# Patient Record
Sex: Male | Born: 1990 | Race: Black or African American | Hispanic: No | Marital: Married
Health system: Southern US, Community
[De-identification: ages and names within clinical notes are randomized; demographics above are authoritative.]

---

## 2019-06-25 DIAGNOSIS — L03119 Cellulitis of unspecified part of limb: Secondary | ICD-10-CM | POA: Diagnosis not present

## 2019-06-25 DIAGNOSIS — T148XXA Other injury of unspecified body region, initial encounter: Secondary | ICD-10-CM | POA: Diagnosis not present

## 2020-04-13 ENCOUNTER — Ambulatory Visit
Admission: RE | Admit: 2020-04-13 | Discharge: 2020-04-13 | Disposition: A | Discharge status: Home / Self Care | Payer: BC Managed Care – PPO | Source: Ambulatory Visit | Attending: Chiropractic Medicine | Admitting: Chiropractic Medicine

## 2020-04-13 ENCOUNTER — Other Ambulatory Visit: Payer: Self-pay | Admitting: Chiropractic Medicine

## 2020-04-13 DIAGNOSIS — M25572 Pain in left ankle and joints of left foot: Secondary | ICD-10-CM

## 2020-04-13 DIAGNOSIS — M542 Cervicalgia: Secondary | ICD-10-CM | POA: Diagnosis not present

## 2020-04-13 DIAGNOSIS — M545 Low back pain, unspecified: Secondary | ICD-10-CM

## 2020-04-13 DIAGNOSIS — M25551 Pain in right hip: Secondary | ICD-10-CM

## 2020-04-13 DIAGNOSIS — R102 Pelvic and perineal pain: Secondary | ICD-10-CM | POA: Diagnosis not present

## 2020-04-13 DIAGNOSIS — M546 Pain in thoracic spine: Secondary | ICD-10-CM | POA: Diagnosis not present

## 2020-07-17 DIAGNOSIS — Z0000 Encounter for general adult medical examination without abnormal findings: Secondary | ICD-10-CM | POA: Diagnosis not present

## 2020-07-17 DIAGNOSIS — Z13220 Encounter for screening for lipoid disorders: Secondary | ICD-10-CM | POA: Diagnosis not present

## 2020-08-17 DIAGNOSIS — Z20828 Contact with and (suspected) exposure to other viral communicable diseases: Secondary | ICD-10-CM | POA: Diagnosis not present

## 2020-08-17 DIAGNOSIS — Z20822 Contact with and (suspected) exposure to covid-19: Secondary | ICD-10-CM | POA: Diagnosis not present

## 2020-10-18 DIAGNOSIS — F4323 Adjustment disorder with mixed anxiety and depressed mood: Secondary | ICD-10-CM | POA: Diagnosis not present

## 2020-10-25 DIAGNOSIS — F4323 Adjustment disorder with mixed anxiety and depressed mood: Secondary | ICD-10-CM | POA: Diagnosis not present

## 2020-11-01 DIAGNOSIS — F4323 Adjustment disorder with mixed anxiety and depressed mood: Secondary | ICD-10-CM | POA: Diagnosis not present

## 2020-11-08 DIAGNOSIS — F4323 Adjustment disorder with mixed anxiety and depressed mood: Secondary | ICD-10-CM | POA: Diagnosis not present

## 2020-11-15 DIAGNOSIS — F4323 Adjustment disorder with mixed anxiety and depressed mood: Secondary | ICD-10-CM | POA: Diagnosis not present

## 2020-11-22 DIAGNOSIS — F4323 Adjustment disorder with mixed anxiety and depressed mood: Secondary | ICD-10-CM | POA: Diagnosis not present

## 2020-11-29 DIAGNOSIS — F4323 Adjustment disorder with mixed anxiety and depressed mood: Secondary | ICD-10-CM | POA: Diagnosis not present

## 2020-12-06 DIAGNOSIS — F4323 Adjustment disorder with mixed anxiety and depressed mood: Secondary | ICD-10-CM | POA: Diagnosis not present

## 2021-01-04 DIAGNOSIS — M71572 Other bursitis, not elsewhere classified, left ankle and foot: Secondary | ICD-10-CM | POA: Diagnosis not present

## 2021-01-04 DIAGNOSIS — S90851A Superficial foreign body, right foot, initial encounter: Secondary | ICD-10-CM | POA: Diagnosis not present

## 2021-01-04 DIAGNOSIS — L97429 Non-pressure chronic ulcer of left heel and midfoot with unspecified severity: Secondary | ICD-10-CM | POA: Diagnosis not present

## 2021-01-04 DIAGNOSIS — L97521 Non-pressure chronic ulcer of other part of left foot limited to breakdown of skin: Secondary | ICD-10-CM | POA: Diagnosis not present

## 2021-01-09 DIAGNOSIS — L97521 Non-pressure chronic ulcer of other part of left foot limited to breakdown of skin: Secondary | ICD-10-CM | POA: Diagnosis not present

## 2021-01-09 DIAGNOSIS — B070 Plantar wart: Secondary | ICD-10-CM | POA: Diagnosis not present

## 2021-01-24 DIAGNOSIS — L97521 Non-pressure chronic ulcer of other part of left foot limited to breakdown of skin: Secondary | ICD-10-CM | POA: Diagnosis not present

## 2021-09-09 IMAGING — DX DG ANKLE COMPLETE 3+V*L*
3 series · 3 of 3 positions shown · non-contrast
Comparison: None.

CLINICAL DATA: Left ankle pain

EXAM:
LEFT ANKLE COMPLETE - 3+ VIEW

[dg ankle complete left (1 of 3)]
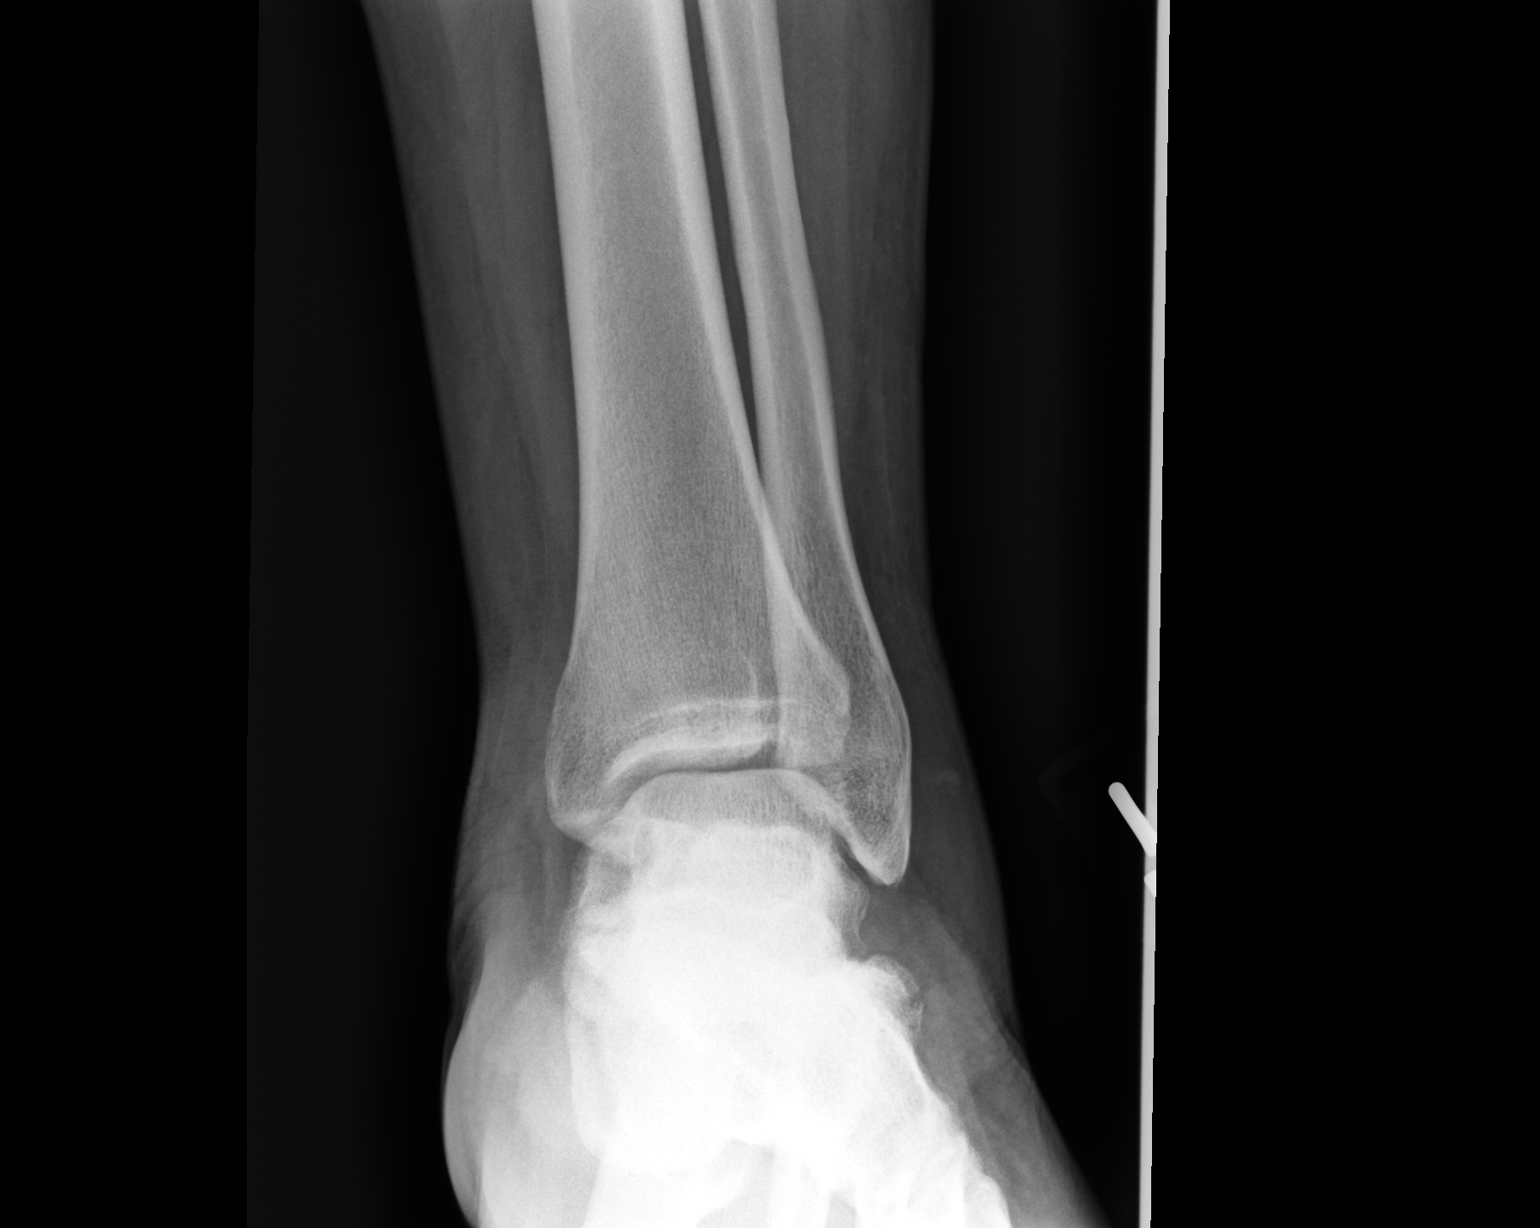

[dg ankle complete left (2 of 3)]
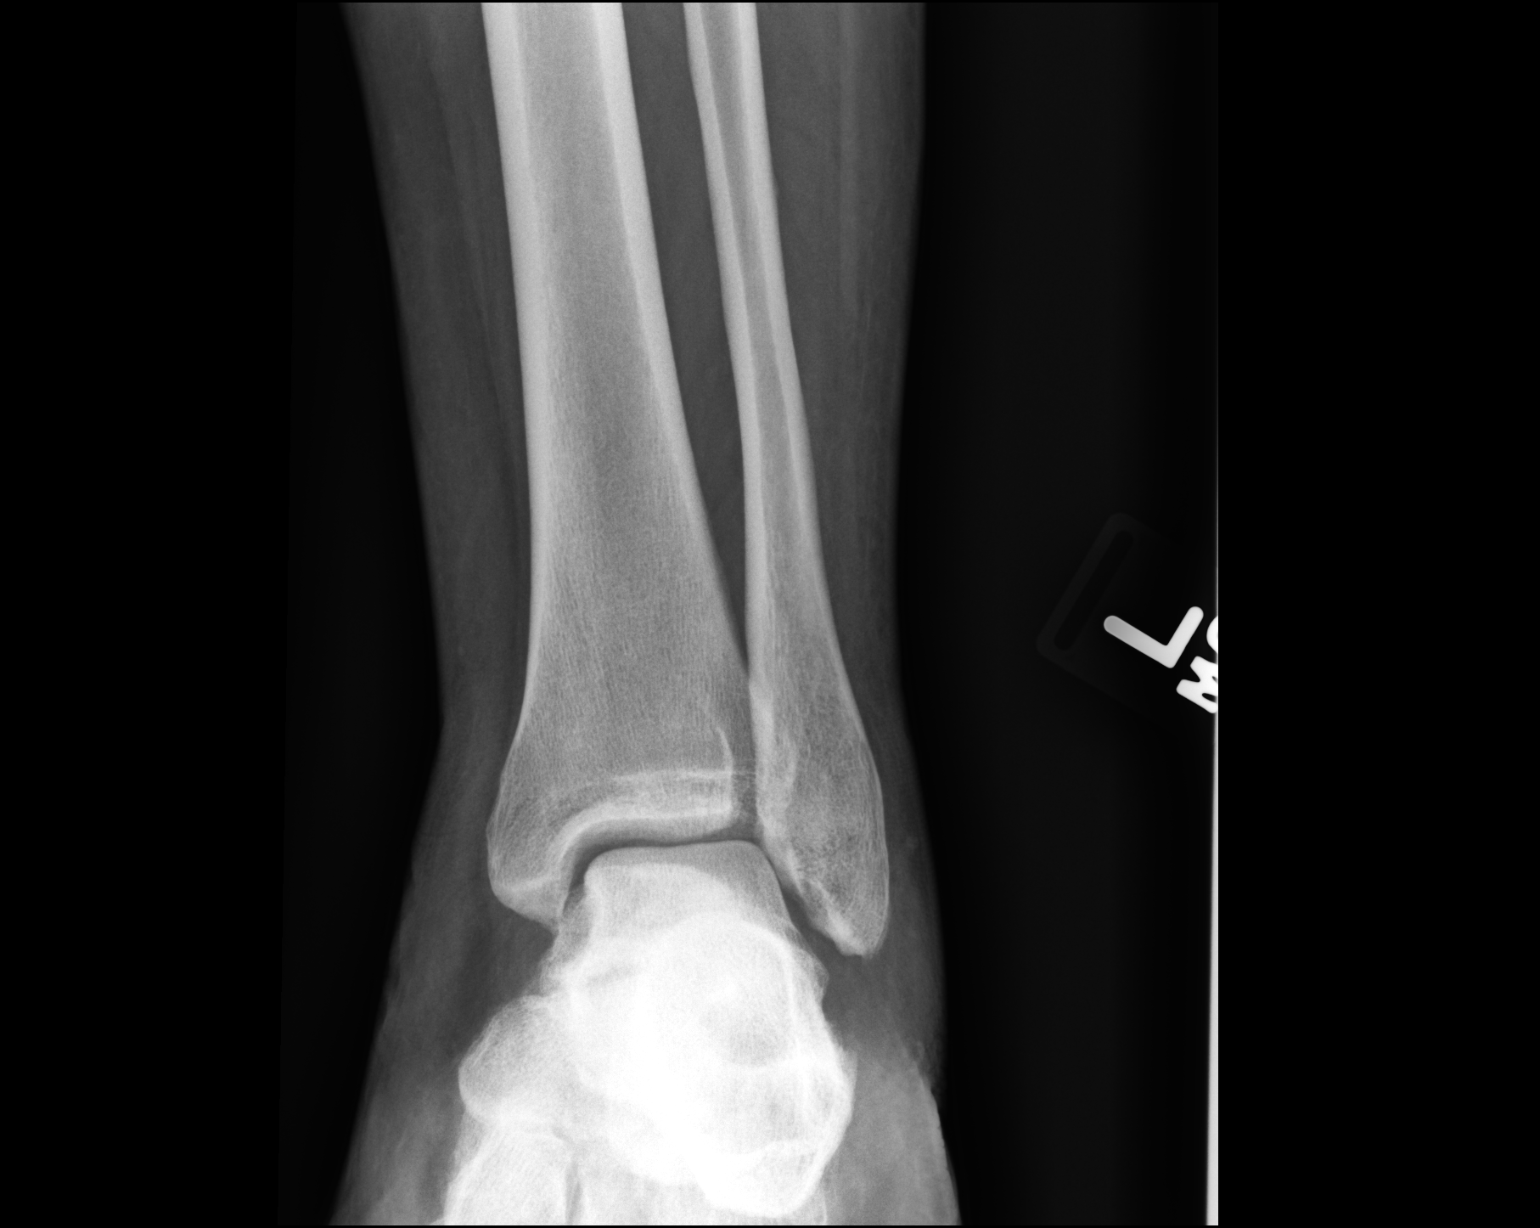

[dg ankle complete left (3 of 3)]
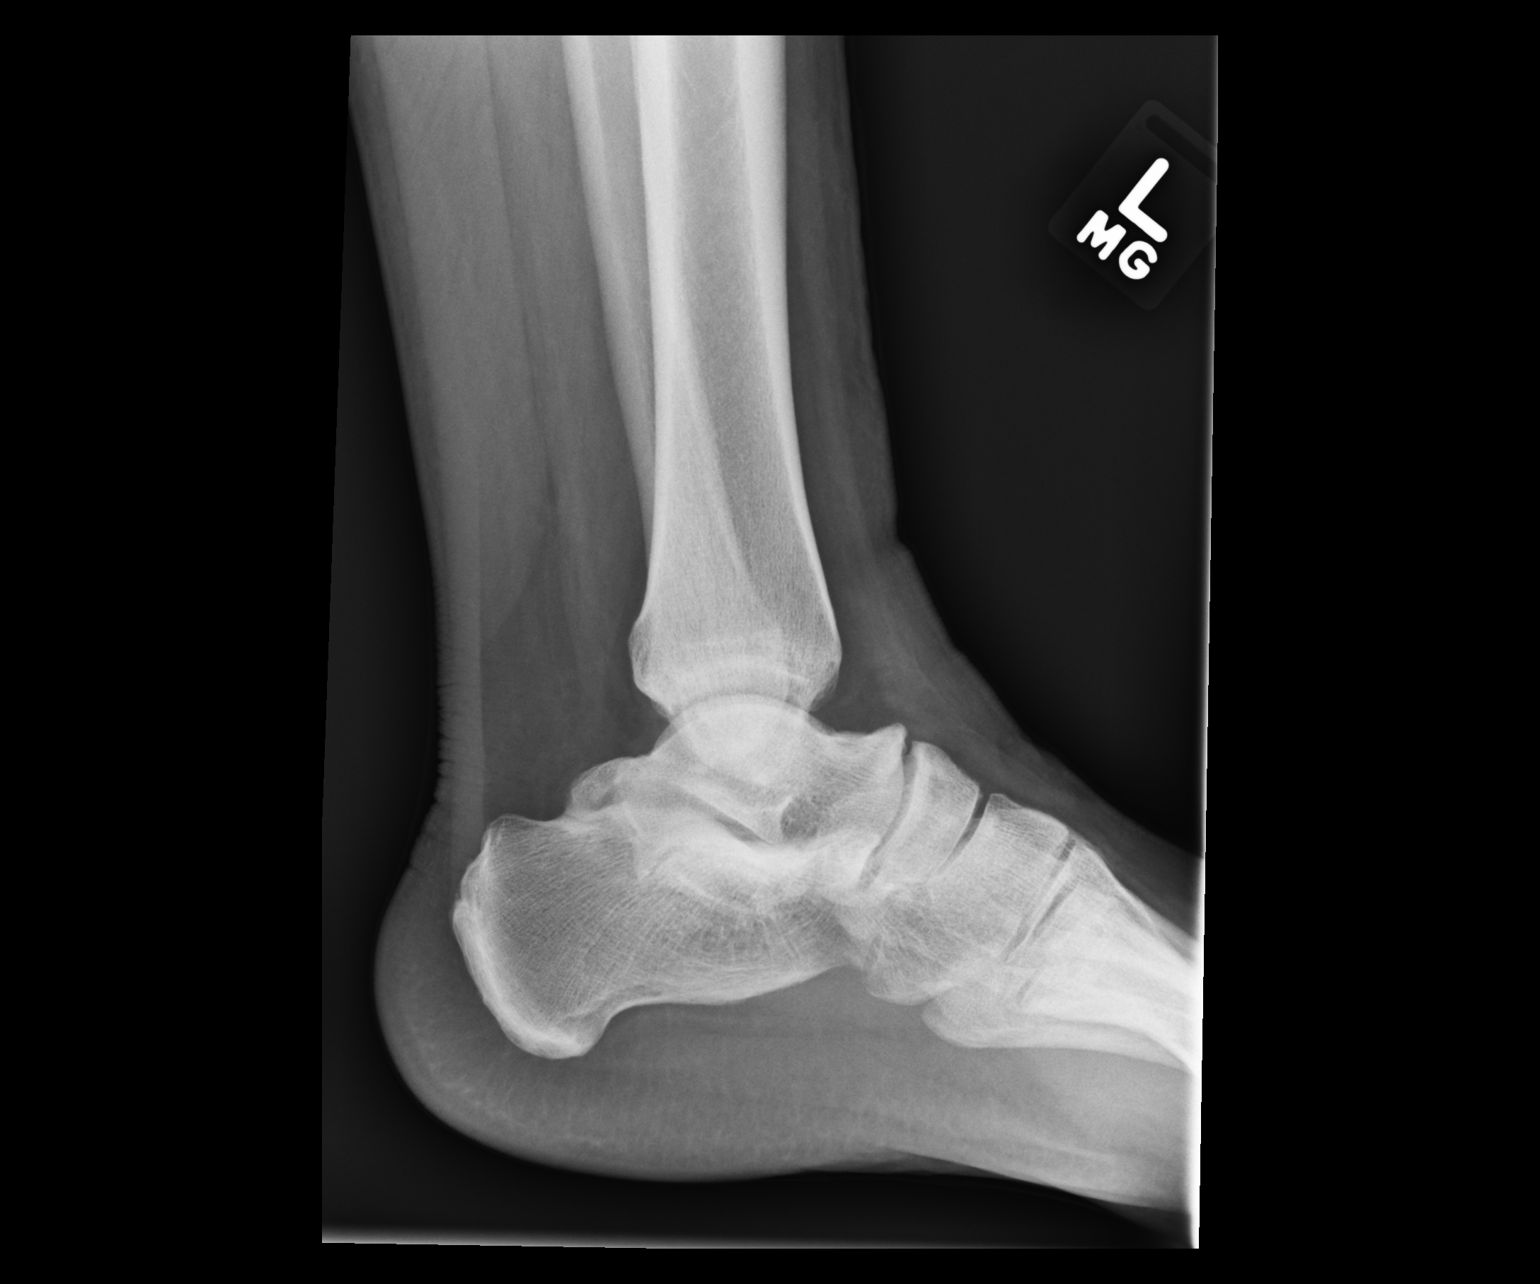

[3 of 3 positions shown; findings below may reference images not displayed]

FINDINGS: There is no evidence of fracture, dislocation, or joint effusion.
Tibiotalar joint space is maintained. Mild talonavicular arthropathy
with dorsal proliferation. Soft tissues are unremarkable.
IMPRESSION: 1. No acute osseous abnormality, left ankle.
2. Mild talonavicular arthropathy.

## 2021-09-09 IMAGING — DX DG PELVIS 1-2V
1 series · 1 of 1 positions shown · non-contrast
Comparison: None.

CLINICAL DATA: Multifocal joint pain.

EXAM:
PELVIS - 1-2 VIEW

[dg pelvis 1-2 views]
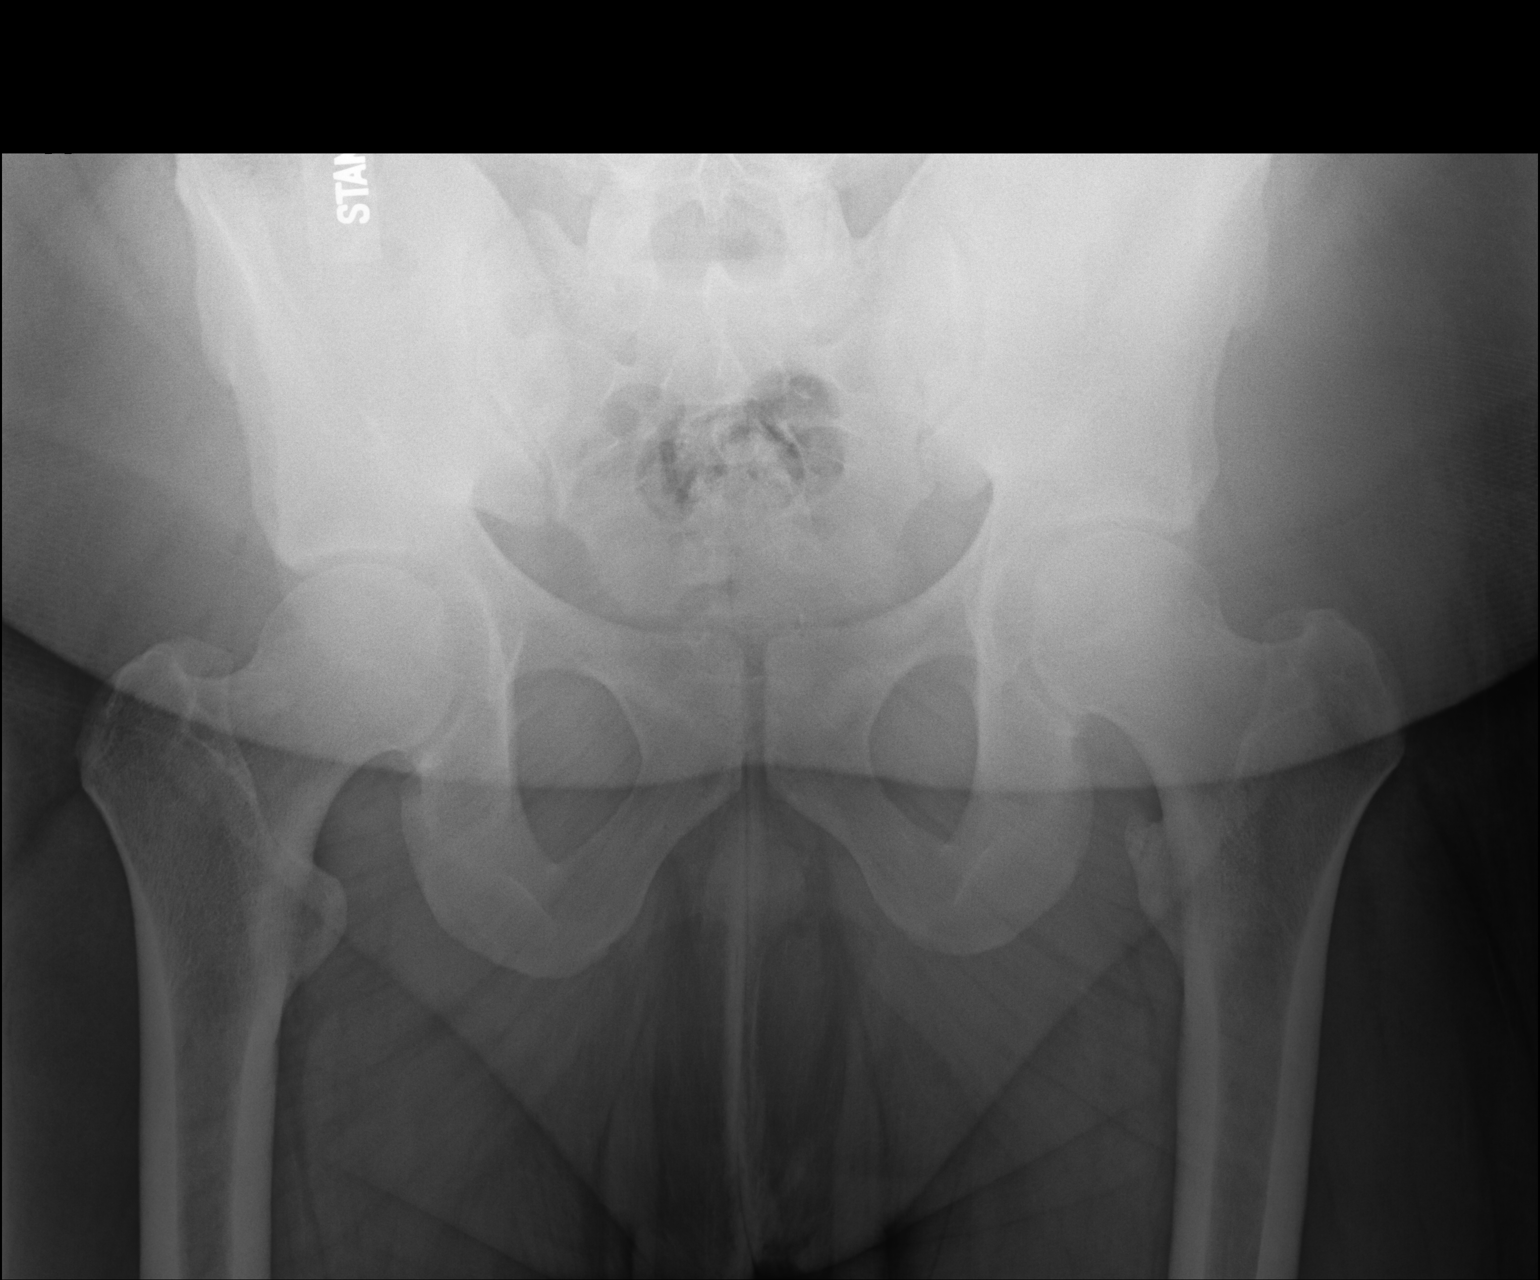

[1 of 1 positions shown; findings below may reference images not displayed]

FINDINGS: There is no evidence of pelvic fracture or diastasis. No pelvic bone
lesions are seen.
IMPRESSION: Negative exam.

## 2021-10-02 ENCOUNTER — Encounter (HOSPITAL_BASED_OUTPATIENT_CLINIC_OR_DEPARTMENT_OTHER): Payer: BC Managed Care – PPO | Admitting: General Surgery

## 2021-10-29 ENCOUNTER — Encounter (HOSPITAL_BASED_OUTPATIENT_CLINIC_OR_DEPARTMENT_OTHER): Payer: BC Managed Care – PPO | Attending: Physician Assistant | Admitting: Internal Medicine

## 2022-12-10 ENCOUNTER — Other Ambulatory Visit (HOSPITAL_COMMUNITY): Payer: Self-pay | Admitting: General Surgery

## 2022-12-10 ENCOUNTER — Encounter (HOSPITAL_BASED_OUTPATIENT_CLINIC_OR_DEPARTMENT_OTHER): Payer: BC Managed Care – PPO | Attending: General Surgery | Admitting: General Surgery

## 2022-12-10 DIAGNOSIS — L97422 Non-pressure chronic ulcer of left heel and midfoot with fat layer exposed: Secondary | ICD-10-CM

## 2022-12-10 DIAGNOSIS — Z6841 Body Mass Index (BMI) 40.0 and over, adult: Secondary | ICD-10-CM | POA: Insufficient documentation

## 2022-12-10 DIAGNOSIS — S91302A Unspecified open wound, left foot, initial encounter: Secondary | ICD-10-CM | POA: Diagnosis not present

## 2022-12-11 NOTE — Progress Notes (Signed)
SANTOSH, PETTER ([id number]) [id number] Page 1 of 8 Visit Report for 12/10/2022 Allergy List Details Patient Name: Date of Service: Garrett Herrera, Garrett Herrera 12/10/2022 9:00 A M Medical Record Number: [id number] Patient Account Number: [id number] Date of Birth/Sex: Treating RN: 1990-11-02 (32 y.o. Dianna Limbo Primary Care Kalise Fickett: Milus Height Other Clinician: Referring Alroy Portela: Treating Gelena Klosinski/Extender: Stevan Born Weeks in Treatment: 0 Allergies Active Allergies nickel Reaction: rash Allergy Notes Electronic Signature(Herrera) Signed: 12/10/2022 4:44:16 PM By: Zenaida Deed RN, BSN Entered By: Zenaida Deed on 12/10/2022 10:12:54 -------------------------------------------------------------------------------- Arrival Information Details Patient Name: Date of Service: Garrett Herrera, Garrett Herrera 12/10/2022 9:00 A M Medical Record Number: [id number] Patient Account Number: [id number] Date of Birth/Sex: Treating RN: 06/25/1991 (32 y.o. Dianna Limbo Primary Care Lakasha Mcfall: Milus Height Other Clinician: Referring Chai Routh: Treating Jahnavi Muratore/Extender: Verneda Skill in Treatment: 0 Visit Information Patient Arrived: Ambulatory Arrival Time: 08:55 Accompanied By: self Transfer Assistance: None Patient Identification Verified: Yes Secondary Verification Process Completed: Yes Patient Requires Transmission-Based Precautions: No Patient Has Alerts: No Electronic Signature(Herrera) Signed: 12/10/2022 4:44:16 PM By: Zenaida Deed RN, BSN Entered By: Zenaida Deed on 12/10/2022 09:19:19 -------------------------------------------------------------------------------- Clinic Level of Care Assessment Details Patient Name: Date of Service: Garrett Herrera, Garrett Herrera 12/10/2022 9:00 A M Medical Record Number: [id number] Patient Account Number: [id number] Date of Birth/Sex: Treating RN: 21-Jul-1991 (32 y.o. [name] Primary Care Davarius Ridener: Milus Height Other Clinician: Referring Gagan Dillion: Treating Steffani Dionisio/Extender: Verneda Skill in Treatment: 0 Clinic Level of Care Assessment Items TOOL 1 Quantity Score []  - 0 Use when EandM and Procedure is performed on INITIAL visit ASSESSMENTS - Nursing Assessment / Reassessment X- 1 20 General Physical Exam (combine w/ comprehensive assessment (listed just below) when performed on new pt. [address]Garrett Herrera, Garrett Herrera ([id number]) [id number] Page 2 of 8 X- 1 25 Comprehensive Assessment (HX, ROS, Risk Assessments, Wounds Hx, etc.) ASSESSMENTS - Wound and Skin Assessment / Reassessment []  - 0 Dermatologic / Skin Assessment (not related to wound area) ASSESSMENTS - Ostomy and/or Continence Assessment and Care []  - 0 Incontinence Assessment and Management []  - 0 Ostomy Care Assessment and Management (repouching, etc.) PROCESS - Coordination of Care X - Simple Patient / Family Education for ongoing care 1 15 []  - 0 Complex (extensive) Patient / Family Education for ongoing care X- 1 10 Staff obtains Chiropractor, Records, T Results / Process Orders est []  - 0 Staff telephones HHA, Nursing Homes / Clarify orders / etc []  - 0 Routine Transfer to another Facility (non-emergent condition) []  - 0 Routine Hospital Admission (non-emergent condition) X- 1 15 New Admissions / Manufacturing engineer / Ordering NPWT Apligraf, etc. , []  - 0 Emergency Hospital Admission (emergent condition) PROCESS - Special Needs []  - 0 Pediatric / Minor Patient Management []  - 0 Isolation Patient Management []  - 0 Hearing / Language / Visual special needs []  - 0 Assessment of Community assistance (transportation, D/C planning, etc.) []  - 0 Additional assistance / Altered mentation []  - 0 Support Surface(Herrera) Assessment (bed, cushion, seat, etc.) INTERVENTIONS - Miscellaneous []  - 0 External ear exam []  - 0 Patient  Transfer (multiple staff / Nurse, adult / Similar devices) []  - 0 Simple Staple / Suture removal (25 or less) []  - 0 Complex Staple / Suture removal (26 or more) []  - 0 Hypo/Hyperglycemic Management (do not check if billed separately) X- 1 15 Ankle / Brachial Index (ABI) - do not check if billed separately Has the patient been seen at the hospital  within the last three years: Yes Total Score: 100 Level Of Care: New/Established - Level 3 Electronic Signature(Herrera) Signed: 12/10/2022 4:44:16 PM By: Zenaida Deed RN, BSN Entered By: Zenaida Deed on 12/10/2022 09:59:43 -------------------------------------------------------------------------------- Encounter Discharge Information Details Patient Name: Date of Service: Garrett Herrera, Garrett Herrera 12/10/2022 9:00 A M Medical Record Number: [id number] Patient Account Number: [id number] Date of Birth/Sex: Treating RN: 03/18/1991 (32 y.o. [name] Primary Care Nakisha Chai: Milus Height Other Clinician: Referring Sascha Baugher: Treating Shamir Sedlar/Extender: Verneda Skill in Treatment: 0 Encounter Discharge Information Items Post Procedure Vitals Discharge Condition: Stable Temperature (F): 99.2 Ambulatory Status: Ambulatory Pulse (bpm): 85 Discharge Destination: Home Respiratory Rate (breaths/min): 18 Transportation: Private Auto Blood Pressure (mmHg): 130/84 Accompanied By: self Schedule Follow-up Appointment: Carolin Guernsey ([id number]) [id number] Page 3 of 8 Clinical Summary of Care: Patient Declined Electronic Signature(Herrera) Signed: 12/10/2022 4:44:16 PM By: Zenaida Deed RN, BSN Entered By: Zenaida Deed on 12/10/2022 10:00:42 -------------------------------------------------------------------------------- Lower Extremity Assessment Details Patient Name: Date of Service: Garrett Herrera, Garrett Herrera 12/10/2022 9:00 A M Medical Record Number: [id number] Patient Account Number:  [id number] Date of Birth/Sex: Treating RN: Sep 19, 1990 (32 y.o. Dianna Limbo Primary Care Calen Posch: Milus Height Other Clinician: Referring Zelena Bushong: Treating Amal Renbarger/Extender: Verneda Skill in Treatment: 0 Edema Assessment Assessed: [Left: No] [Right: No] [Left: Edema] [Right: :] Calf Left: Right: Point of Measurement: 33 cm From Medial Instep 45.2 cm Ankle Left: Right: Point of Measurement: 9 cm From Medial Instep 25.2 cm Knee To Floor Left: Right: From Medial Instep 44 cm Vascular Assessment Pulses: Dorsalis Pedis Palpable: [Left:Yes] Blood Pressure: Brachial: [Left:130] Ankle: [Left:Dorsalis Pedis: 166 1.28] Electronic Signature(Herrera) Signed: 12/10/2022 12:25:06 PM By: Karie Schwalbe RN Signed: 12/10/2022 4:44:16 PM By: Zenaida Deed RN, BSN Entered By: Zenaida Deed on 12/10/2022 09:27:31 -------------------------------------------------------------------------------- Multi Wound Chart Details Patient Name: Date of Service: Garrett Herrera 12/10/2022 9:00 A M Medical Record Number: [id number] Patient Account Number: [id number] Date of Birth/Sex: Treating RN: 1990-08-18 (31 y.o. M) Primary Care Melbourne Jakubiak: Milus Height Other Clinician: Referring Mashal Slavick: Treating Prakriti Carignan/Extender: Verneda Skill in Treatment: 0 Vital Signs Height(in): 67 Pulse(bpm): 85 Weight(lbs): 302 Blood Pressure(mmHg): 130/84 Body Mass Index(BMI): 47.3 Temperature(F): 99.2 Respiratory Rate(breaths/min): 16 Loughney, Anastacio ([id number]) [1:Photos:] [N/A:N/A] Left, Lateral Foot N/A N/A Wound Location: Trauma N/A N/A Wounding Event: Trauma, Other N/A N/A Primary Etiology: Asthma, Osteoarthritis N/A N/A Comorbid History: 10/10/2019 N/A N/A Date Acquired: 0 N/A N/A Weeks of Treatment: Open N/A N/A Wound Status: No N/A N/A Wound Recurrence: 0.4x0.4x0.1 N/A N/A Measurements L x W x D (cm) 0.126 N/A N/A A (cm)  : rea 0.013 N/A N/A Volume (cm) : Full Thickness Without Exposed N/A N/A Classification: Support Structures Small N/A N/A Exudate Amount: Serosanguineous N/A N/A Exudate Type: red, brown N/A N/A Exudate Color: Flat and Intact N/A N/A Wound Margin: Large (67-100%) N/A N/A Granulation Amount: Red, Hyper-granulation N/A N/A Granulation Quality: None Present (0%) N/A N/A Necrotic Amount: Fat Layer (Subcutaneous Tissue): Yes N/A N/A Exposed Structures: Fascia: No Tendon: No Muscle: No Joint: No Bone: No Small (1-33%) N/A N/A Epithelialization: No Abnormalities Noted N/A N/A Periwound Skin Texture: No Abnormalities Noted N/A N/A Periwound Skin Moisture: No Abnormalities Noted N/A N/A Periwound Skin Color: No Abnormality N/A N/A Temperature: Yes N/A N/A Tenderness on Palpation: Treatment Notes Electronic Signature(Herrera) Signed: 12/10/2022 9:51:08 AM By: Duanne Guess MD FACS Entered By: Duanne Guess on 12/10/2022 09:51:08 -------------------------------------------------------------------------------- Multi-Disciplinary Care Plan Details Patient Name: Date of Service: Garrett Herrera  12/10/2022 9:00 A M Medical Record Number: [id number] Patient Account Number: [id number] Date of Birth/Sex: Treating RN: [birth year] (32 y.o. [name] Primary Care Lailyn Appelbaum: Milus Height Other Clinician: Referring Kmari Brian: Treating Irl Bodie/Extender: Verneda Skill in Treatment: 0 Multidisciplinary Care Plan reviewed with physician Active Inactive Wound/Skin Impairment Nursing Diagnoses: Impaired tissue integrity Knowledge deficit related to ulceration/compromised skin integrity Goals: Patient/caregiver will verbalize understanding of skin care regimen Date Initiated: 12/10/2022 Target Resolution Date: 01/07/2023 Goal Status: Active Ulcer/skin breakdown will have a volume reduction of 30% by week 4 Date Initiated: 12/10/2022 Target Resolution  Date: 01/07/2023 Garrett Herrera, Garrett Herrera ([id number]) [phone].pdf Page 5 of 8 Goal Status: Active Interventions: Assess patient/caregiver ability to obtain necessary supplies Assess patient/caregiver ability to perform ulcer/skin care regimen upon admission and as needed Assess ulceration(Herrera) every visit Provide education on ulcer and skin care Treatment Activities: Skin care regimen initiated : 12/10/2022 Topical wound management initiated : 12/10/2022 Notes: Electronic Signature(Herrera) Signed: 12/10/2022 4:44:16 PM By: Zenaida Deed RN, BSN Entered By: Zenaida Deed on 12/10/2022 09:38:59 -------------------------------------------------------------------------------- Pain Assessment Details Patient Name: Date of Service: Garrett Herrera, Garrett Herrera 12/10/2022 9:00 A M Medical Record Number: [id number] Patient Account Number: [id number] Date of Birth/Sex: Treating RN: 1991/07/25 (32 y.o. Dianna Limbo Primary Care Griselda Bramblett: Milus Height Other Clinician: Referring Meggin Ola: Treating Wilson Dusenbery/Extender: Verneda Skill in Treatment: 0 Active Problems Location of Pain Severity and Description of Pain Patient Has Paino Yes Site Locations Pain Location: Generalized Pain With Dressing Change: No Duration of the Pain. Constant / Intermittento Constant Rate the pain. Current Pain Level: 7 Worst Pain Level: 10 Least Pain Level: 3 Tolerable Pain Level: 5 Character of Pain Describe the Pain: Difficult to Pinpoint Pain Management and Medication Current Pain Management: Medication: No Cold Application: No Rest: No Massage: No Activity: No T.E.N.Herrera.: No Heat Application: No Leg drop or elevation: No Is the Current Pain Management Adequate: Adequate How does your wound impact your activities of daily livingo Sleep: No Bathing: No Appetite: No Relationship With Others: No Bladder Continence: No Emotions: No Bowel Continence: No Work:  No Toileting: No Drive: No Dressing: No Hobbies: No Electronic Signature(sDARTANION, TEO ([id number]) [id number] Page 6 of 8 Signed: 12/10/2022 12:25:06 PM By: Karie Schwalbe RN Entered By: Karie Schwalbe on 12/10/2022 09:22:52 -------------------------------------------------------------------------------- Patient/Caregiver Education Details Patient Name: Date of Service: Garrett Herrera 5/1/2024andnbsp9:00 A M Medical Record Number: [id number] Patient Account Number: [id number] Date of Birth/Gender: Treating RN: November 01, 1990 (32 y.o. [name] Primary Care Physician: Milus Height Other Clinician: Referring Physician: Treating Physician/Extender: Verneda Skill in Treatment: 0 Education Assessment Education Provided To: Patient Education Topics Provided Wound/Skin Impairment: Methods: Explain/Verbal Responses: Reinforcements needed, State content correctly Electronic Signature(Herrera) Signed: 12/10/2022 4:44:16 PM By: Zenaida Deed RN, BSN Entered By: Zenaida Deed on 12/10/2022 09:58:08 -------------------------------------------------------------------------------- Wound Assessment Details Patient Name: Date of Service: Garrett Herrera, Garrett Herrera 12/10/2022 9:00 A M Medical Record Number: [id number] Patient Account Number: [id number] Date of Birth/Sex: Treating RN: Apr 16, 1991 (31 y.o. [name] Primary Care Rajohn Henery: Milus Height Other Clinician: Referring Clarice Zulauf: Treating Dak Szumski/Extender: Verneda Skill in Treatment: 0 Wound Status Wound Number: 1 Primary Etiology: Trauma, Other Wound Location: Left, Lateral Foot Wound Status: Open Wounding Event: Trauma Comorbid History: Asthma, Osteoarthritis Date Acquired: 10/10/2019 Weeks Of Treatment: 0 Clustered Wound: No Photos Wound Measurements Length: (cm) 0.4 Width: (cm) 0.4 Depth: (cm) 0.1 Area: (cm) 0.126 Volume: (cm)  0.013 % Reduction in Area: % Reduction in Volume:  Epithelialization: Small (1-33%) Tunneling: No Undermining: No Wound Description Mukherjee, Tarrence ([id number]) Classification: Full Thickness Without Exposed Support Structures Wound Margin: Flat and Intact Exudate Amount: Small Exudate Type: Serosanguineous Exudate Color: red, brown [id number] Page 7 of 8 Foul Odor After Cleansing: No Slough/Fibrino No Wound Bed Granulation Amount: Large (67-100%) Exposed Structure Granulation Quality: Red, Hyper-granulation Fascia Exposed: No Necrotic Amount: None Present (0%) Fat Layer (Subcutaneous Tissue) Exposed: Yes Tendon Exposed: No Muscle Exposed: No Joint Exposed: No Bone Exposed: No Periwound Skin Texture Texture Color No Abnormalities Noted: Yes No Abnormalities Noted: Yes Moisture Temperature / Pain No Abnormalities Noted: Yes Temperature: No Abnormality Tenderness on Palpation: Yes Treatment Notes Wound #1 (Foot) Wound Laterality: Left, Lateral Cleanser Peri-Wound Care Topical Primary Dressing Hydrofera Blue Ready Transfer Foam, 2.5x2.5 (in/in) Discharge Instruction: Apply directly to wound bed as directed Secondary Dressing Woven Gauze Sponge, Non-Sterile 4x4 in Discharge Instruction: Apply over primary dressing as directed. Secured With [employer], 4.5x3.1 (in/yd) Discharge Instruction: Secure with Kerlix as directed. Transpore Surgical Tape, 2x10 (in/yd) Discharge Instruction: Secure dressing with tape as directed. Compression Wrap Compression Stockings Add-Ons Electronic Signature(Herrera) Signed: 12/10/2022 4:44:16 PM By: Zenaida Deed RN, BSN Entered By: Zenaida Deed on 12/10/2022 09:23:09 -------------------------------------------------------------------------------- Vitals Details Patient Name: Date of Service: Garrett Herrera, Garrett Herrera 12/10/2022 9:00 A M Medical Record Number: [id number] Patient Account Number:  [id number] Date of Birth/Sex: Treating RN: [birth year] (32 y.o. Dianna Limbo Primary Care Sally Menard: Milus Height Other Clinician: Referring Asencion Guisinger: Treating Suann Klier/Extender: Verneda Skill in Treatment: 0 Vital Signs Time Taken: 09:01 Temperature (F): 99.2 Height (in): 67 Pulse (bpm): 85 Source: Stated Respiratory Rate (breaths/min): 16 Weight (lbs): 302 Blood Pressure (mmHg): 130/84 Source: Stated Reference Range: 80 - 120 mg / dl Garrett Herrera, Garrett Herrera ([id number]) [id number] Page 8 of 8 Body Mass Index (BMI): 47.3 Electronic Signature(Herrera) Signed: 12/10/2022 12:25:06 PM By: Karie Schwalbe RN Entered By: Karie Schwalbe on 12/10/2022 09:04:29

## 2022-12-11 NOTE — Progress Notes (Signed)
CONNY, MOENING ([id number]) [id number] Page 1 of 4 Visit Report for 12/10/2022 Abuse Risk Screen Details Patient Name: Date of Service: Garrett Herrera, Garrett Herrera 12/10/2022 9:00 A M Medical Record Number: [id number] Patient Account Number: [id number] Date of Birth/Sex: Treating RN: 08/29/1990 (32 y.o. Dianna Limbo Primary Care Neesa Knapik: Milus Height Other Clinician: Referring Charyl Minervini: Treating Vikram Tillett/Extender: Verneda Skill in Treatment: 0 Abuse Risk Screen Items Answer ABUSE RISK SCREEN: Has anyone close to you tried to hurt or harm you recentlyo No Do you feel uncomfortable with anyone in your familyo No Has anyone forced you do things that you didnt want to doo No Electronic Signature(Herrera) Signed: 12/10/2022 12:25:06 PM By: Karie Schwalbe RN Entered By: Karie Schwalbe on 12/10/2022 09:11:38 -------------------------------------------------------------------------------- Activities of Daily Living Details Patient Name: Date of Service: Garrett Herrera, Garrett Herrera 12/10/2022 9:00 A M Medical Record Number: [id number] Patient Account Number: [id number] Date of Birth/Sex: Treating RN: [birth year] (32 y.o. Dianna Limbo Primary Care Kahleah Crass: Milus Height Other Clinician: Referring Amadi Frady: Treating Udell Blasingame/Extender: Verneda Skill in Treatment: 0 Activities of Daily Living Items Answer Activities of Daily Living (Please select one for each item) Drive Automobile Completely Able T Medications ake Completely Able Use T elephone Completely Able Care for Appearance Completely Able Use T oilet Completely Able Bath / Shower Completely Able Dress Self Completely Able Feed Self Completely Able Walk Completely Able Get In / Out Bed Completely Able Housework Completely Able Prepare Meals Completely Able Handle Money Completely Able Shop for Self Completely Able Electronic Signature(Herrera) Signed:  12/10/2022 12:25:06 PM By: Karie Schwalbe RN Entered By: Karie Schwalbe on 12/10/2022 09:12:16 -------------------------------------------------------------------------------- Education Screening Details Patient Name: Date of Service: Garrett Herrera, Garrett Herrera 12/10/2022 9:00 A M Medical Record Number: [id number] Patient Account Number: [id number] Date of Birth/Sex: Treating RN: November 17, 1990 (31 y.o. Dianna Limbo Primary Care Chelsea Nusz: Milus Height Other Clinician: Referring Tracey Stewart: Treating Briyah Wheelwright/Extender: Verneda Skill in Treatment: 0 [place], Janyth Pupa ([id number]) [id number] Page 2 of 4 Learning Preferences/Education Level/Primary Language Learning Preference: Explanation, Demonstration, Printed Material Highest Education Level: College or Above Preferred Language: Economist Language Barrier: No Translator Needed: No Memory Deficit: No Emotional Barrier: No Cultural/Religious Beliefs Affecting Medical Care: No Physical Barrier Impaired Vision: No Impaired Hearing: No Decreased Hand dexterity: No Knowledge/Comprehension Knowledge Level: High Comprehension Level: High Ability to understand written instructions: High Ability to understand verbal instructions: High Motivation Anxiety Level: Calm Cooperation: Cooperative Education Importance: Acknowledges Need Interest in Health Problems: Asks Questions Perception: Coherent Willingness to Engage in Self-Management High Activities: Readiness to Engage in Self-Management High Activities: Electronic Signature(Herrera) Signed: 12/10/2022 12:25:06 PM By: Karie Schwalbe RN Entered By: Karie Schwalbe on 12/10/2022 09:13:53 -------------------------------------------------------------------------------- Fall Risk Assessment Details Patient Name: Date of Service: Garrett Herrera 12/10/2022 9:00 A M Medical Record Number: [id number] Patient Account  Number: [id number] Date of Birth/Sex: Treating RN: 1990-11-09 (32 y.o. Dianna Limbo Primary Care Graylyn Bunney: Milus Height Other Clinician: Referring Adama Ferber: Treating Windell Musson/Extender: Verneda Skill in Treatment: 0 Fall Risk Assessment Items Have you had 2 or more falls in the last 12 monthso 0 No Have you had any fall that resulted in injury in the last 12 monthso 0 No FALLS RISK SCREEN History of falling - immediate or within 3 months 0 No Secondary diagnosis (Do you have 2 or more medical diagnoseso) 0 No Ambulatory aid None/bed rest/wheelchair/nurse 0 No Crutches/cane/walker 0 No Furniture 0 No Intravenous therapy Access/Saline/Heparin Lock 0  No Gait/Transferring Normal/ bed rest/ wheelchair 0 No Weak (short steps with or without shuffle, stooped but able to lift head while walking, may seek 0 No support from furniture) Impaired (short steps with shuffle, may have difficulty arising from chair, head down, impaired 0 No balance) Mental Status Oriented to own ability 0 No Overestimates or forgets limitations 0 No Risk Level: Low Risk Score: 0 Garrett Herrera, Garrett Herrera ([id number]) [id number] Page 3 of 4 Electronic Signature(Herrera) -------------------------------------------------------------------------------- Foot Assessment Details Patient Name: Date of Service: Garrett Herrera, Garrett Herrera 12/10/2022 9:00 A M Medical Record Number: [id number] Patient Account Number: [id number] Date of Birth/Sex: Treating RN: Aug 08, 1991 (32 y.o. Dianna Limbo Primary Care Sharrieff Spratlin: Milus Height Other Clinician: Referring Rien Marland: Treating Kaitland Lewellyn/Extender: Verneda Skill in Treatment: 0 Foot Assessment Items Site Locations + = Sensation present, - = Sensation absent, C = Callus, U = Ulcer R = Redness, W = Warmth, M = Maceration, PU = Pre-ulcerative lesion F = Fissure, Herrera = Swelling, D =  Dryness Assessment Right: Left: Other Deformity: No No Prior Foot Ulcer: No No Prior Amputation: No No Charcot Joint: No No Ambulatory Status: Ambulatory Without Help Gait: Steady Electronic Signature(Herrera) Signed: 12/10/2022 12:25:06 PM By: Karie Schwalbe RN Entered By: Karie Schwalbe on 12/10/2022 09:20:22 -------------------------------------------------------------------------------- Nutrition Risk Screening Details Patient Name: Date of Service: Garrett Herrera, Garrett Herrera 12/10/2022 9:00 A M Medical Record Number: [id number] Patient Account Number: [id number] Date of Birth/Sex: Treating RN: 1990-11-17 (32 y.o. Dianna Limbo Primary Care Breniyah Romm: Milus Height Other Clinician: Referring Amed Datta: Treating Alynah Schone/Extender: Verneda Skill in Treatment: 0 Height (in): 67 Weight (lbs): 302 Body Mass Index (BMI): 47.3 Garrett Herrera, Garrett Herrera ([id number]) [id number] Page 4 of 4 Nutrition Risk Screening Items Score Screening NUTRITION RISK SCREEN: I have an illness or condition that made me change the kind and/or amount of food I eat 0 No I eat fewer than two meals per day 0 No I eat few fruits and vegetables, or milk products 0 No I have three or more drinks of beer, liquor or wine almost every day 0 No I have tooth or mouth problems that make it hard for me to eat 0 No I don't always have enough money to buy the food I need 0 No I eat alone most of the time 0 No I take three or more different prescribed or over-the-counter drugs a day 0 No Without wanting to, I have lost or gained 10 pounds in the last six months 0 No I am not always physically able to shop, cook and/or feed myself 0 No Nutrition Protocols Good Risk Protocol 0 No interventions needed Moderate Risk Protocol High Risk Proctocol Risk Level: Good Risk Score: 0 Electronic Signature(Herrera) Signed: 12/10/2022 12:25:06 PM By: Karie Schwalbe RN Entered By: Karie Schwalbe on 12/10/2022 09:17:33

## 2022-12-12 NOTE — Progress Notes (Signed)
Garrett Herrera, Garrett Herrera ([id number]) [id number] Page 1 of 8 Visit Report for 12/10/2022 Chief Complaint Document Details Patient Name: Date of Service: Garrett Herrera 12/10/2022 9:00 A M Medical Record Number: [id number] Patient Account Number: [id number] Date of Birth/Sex: Treating RN: [birth year] (32 y.o. M) Primary Care Provider: Milus Height Other Clinician: Referring Provider: Treating Provider/Extender: Verneda Skill in Treatment: 0 Information Obtained from: Patient Chief Complaint Patient seen for complaints of Non-Healing Wound. Electronic Signature(Herrera) Signed: 12/10/2022 9:53:35 AM By: Duanne Guess MD FACS Entered By: Duanne Guess on 12/10/2022 09:53:34 -------------------------------------------------------------------------------- Debridement Details Patient Name: Date of Service: Garrett Herrera 12/10/2022 9:00 A M Medical Record Number: [id number] Patient Account Number: [id number] Date of Birth/Sex: Treating RN: Nov 02, 1990 (32 y.o. Garrett Herrera Primary Care Provider: Milus Height Other Clinician: Referring Provider: Treating Provider/Extender: Verneda Skill in Treatment: 0 Debridement Performed for Assessment: Wound #1 Left,Lateral Foot Performed By: Clinician Garrett Deed, RN Debridement Type: Chemical/Enzymatic/Mechanical Agent Used: gauze and vashe Level of Consciousness (Pre-procedure): Awake and Alert Pre-procedure Verification/Time Out No Taken: Percent of Wound Bed Debrided: Bleeding: None Response to Treatment: Procedure was tolerated well Level of Consciousness (Post- Awake and Alert procedure): Post Debridement Measurements of Total Wound Length: (cm) 0.4 Width: (cm) 0.4 Depth: (cm) 0.1 Volume: (cm) 0.013 Character of Wound/Ulcer Post Debridement: Stable Post Procedure Diagnosis Same as Pre-procedure Electronic Signature(Herrera) Signed: 12/10/2022 11:01:09 AM  By: Duanne Guess MD FACS Signed: 12/10/2022 4:44:16 PM By: Garrett Deed RN, BSN Entered By: Garrett Herrera on 12/10/2022 09:57:49 -------------------------------------------------------------------------------- HPI Details Patient Name: Date of Service: Garrett Herrera 12/10/2022 9:00 A M Medical Record Number: [id number] Patient Account Number: [id number] Date of Birth/Sex: Treating RN: March 29, 1991 (32 y.o. M) Primary Care Provider: Milus Height Other Clinician: Referring Provider: Treating Provider/Extender: Garrett Herrera, Garrett Herrera ([id number]) [id number] Page 2 of 8 Herrera in Treatment: 0 History of Present Illness HPI Description: ADMISSION 12/10/2022 This is a 32 year old otherwise healthy young man with no significant medical history. He reports having stopped or been poked with something in his apartment a couple of years ago that resulted in a small wound on his left posterior heel. The wound that was created healed but has periodically reopened multiple times since then. He did see the podiatrist about a year ago and reports that an x-ray taken at the time did not show any retained foreign object. He also had an ultrasound with the same result. The area is quite tender and has hypertrophic granulation tissue present. He says that it has never drained purulent material. ABI in clinic today was 1.28. He has been applying Medihoney and says that he has an Transport planner that he occasionally wears. He does not spend time on his feet at work. No obvious signs of infection on exam. Electronic Signature(Herrera) Signed: 12/10/2022 9:55:33 AM By: Duanne Guess MD FACS Entered By: Duanne Guess on 12/10/2022 09:55:32 -------------------------------------------------------------------------------- Physical Exam Details Patient Name: Date of Service: Garrett Herrera, Garrett Herrera 12/10/2022 9:00 A M Medical Record Number: [id number] Patient  Account Number: [id number] Date of Birth/Sex: Treating RN: 1990/11/26 (32 y.o. M) Primary Care Provider: Milus Height Other Clinician: Referring Provider: Treating Provider/Extender: Garrett Herrera in Treatment: 0 Constitutional . . . . No acute distress. Respiratory Normal work of breathing on room air. Cardiovascular . Notes 12/10/2022: On the posterolateral aspect of the patient'Herrera left heel, there is a small circular wound with hypertrophic granulation tissue. It is quite tender. There is no fluctuance  surrounding the wound. No erythema, induration, or drainage. There is no depth to the wound. Electronic Signature(Herrera) Signed: 12/10/2022 9:56:31 AM By: Duanne Guess MD FACS Entered By: Duanne Guess on 12/10/2022 09:56:31 -------------------------------------------------------------------------------- Physician Orders Details Patient Name: Date of Service: Garrett Herrera, Garrett Herrera 12/10/2022 9:00 A M Medical Record Number: [id number] Patient Account Number: [id number] Date of Birth/Sex: Treating RN: 10-22-1990 (32 y.o. Garrett Herrera Primary Care Provider: Milus Height Other Clinician: Referring Provider: Treating Provider/Extender: Verneda Skill in Treatment: 0 Verbal / Phone Orders: No Diagnosis Coding ICD-10 Coding Code Description [phone] Non-pressure chronic ulcer of left heel and midfoot with fat layer exposed E66.01 Morbid (severe) obesity due to excess calories Follow-up Appointments ppointment in 1 week. - Dr. Lady Gary RM 2 Return A Wed 5/8 @ 0915 am Anesthetic Wound #1 Left,Lateral Foot [name], [name] ([id number]) [id number] Page 3 of 8 (In clinic) Topical Lidocaine 4% applied to wound bed Bathing/ Shower/ Hygiene May shower and wash wound with soap and water. Wound Treatment Wound #1 - Foot Wound Laterality: Left, Lateral Prim Dressing: Hydrofera Blue Ready Transfer Foam, 2.5x2.5  (in/in) (DME) (Dispense As Written) 1 x Per Day/30 Days ary Discharge Instructions: Apply directly to wound bed as directed Secondary Dressing: Woven Gauze Sponge, Non-Sterile 4x4 in (DME) (Generic) 1 x Per Day/30 Days Discharge Instructions: Apply over primary dressing as directed. Secured With: [employer], 4.5x3.1 (in/yd) (DME) (Generic) 1 x Per Day/30 Days Discharge Instructions: Secure with Kerlix as directed. Secured With: Transpore Surgical Tape, 2x10 (in/yd) 1 x Per Day/30 Days Discharge Instructions: Secure dressing with tape as directed. Radiology Computed Tomography (CT) Scan , left Lower extremity with contrast - nonhealing ulcer left lateral calcaneus evaluate for foreign body - (ICD10 L97.422 - Non-pressure chronic ulcer of left heel and midfoot with fat layer exposed) Electronic Signature(Herrera) Signed: 12/10/2022 12:16:21 PM By: Duanne Guess MD FACS Signed: 12/10/2022 4:44:16 PM By: Garrett Deed RN, BSN Previous Signature: 12/10/2022 11:01:09 AM Version By: Duanne Guess MD FACS Previous Signature: 12/10/2022 9:56:40 AM Version By: Duanne Guess MD FACS Entered By: Garrett Herrera on 12/10/2022 11:48:59 Prescription 12/10/2022 -------------------------------------------------------------------------------- Ashley Murrain MD Patient Name: Provider: 1991-07-24 [id number] Date of Birth: NPI#: [id number] Sex: DEA #: [phone] Phone #: License #: UPN: Patient Address: [address] [facility] [place], [place] [address] [address] [place], [place] [address] [phone] Allergies nickel Provider'Herrera Orders Computed Tomography (CT) Scan , left Lower extremity with contrast - ICD10: S28.315 - nonhealing ulcer left lateral calcaneus evaluate for foreign body Hand Signature: Date(Herrera): Electronic Signature(Herrera) Signed: 12/10/2022 12:16:21 PM By: Duanne Guess MD FACS Signed:  12/10/2022 4:44:16 PM By: Garrett Deed RN, BSN Previous Signature: 12/10/2022 11:01:09 AM Version By: Duanne Guess MD FACS Previous Signature: 12/10/2022 10:00:36 AM Version By: Duanne Guess MD FACS Entered By: Garrett Herrera on 12/10/2022 11:48:59 -------------------------------------------------------------------------------- Problem List Details Patient Name: Date of Service: Garrett Herrera, Garrett Herrera 12/10/2022 9:00 A M Medical Record Number: [id number] Patient Account Number: [id number] KAULDER, ANN ([id number]) [id number] Page 4 of 8 Date of Birth/Sex: Treating RN: [birth year] (31 y.o. M) Primary Care Provider: Milus Height Other Clinician: Referring Provider: Treating Provider/Extender: Verneda Skill in Treatment: 0 Active Problems ICD-10 Encounter Code Description Active Date MDM Diagnosis L97.422 Non-pressure chronic ulcer of left heel and midfoot with fat layer exposed 12/10/2022 No Yes E66.01 Morbid (severe) obesity due to excess calories 12/10/2022 No Yes Inactive Problems Resolved Problems Electronic  Signature(Herrera) Signed: 12/10/2022 9:51:03 AM By: Duanne Guess MD FACS Previous Signature: 12/10/2022 9:28:52 AM Version By: Duanne Guess MD FACS Entered By: Duanne Guess on 12/10/2022 09:51:02 -------------------------------------------------------------------------------- Progress Note Details Patient Name: Date of Service: Garrett Herrera, Garrett Herrera 12/10/2022 9:00 A M Medical Record Number: [id number] Patient Account Number: [id number] Date of Birth/Sex: Treating RN: 08-Mar-1991 (32 y.o. Garrett Herrera Primary Care Provider: Milus Height Other Clinician: Referring Provider: Treating Provider/Extender: Verneda Skill in Treatment: 0 Subjective Chief Complaint Information obtained from Patient Patient seen for complaints of Non-Healing Wound. History of Present Illness  (HPI) ADMISSION 12/10/2022 This is a 32 year old otherwise healthy young man with no significant medical history. He reports having stopped or been poked with something in his apartment a couple of years ago that resulted in a small wound on his left posterior heel. The wound that was created healed but has periodically reopened multiple times since then. He did see the podiatrist about a year ago and reports that an x-ray taken at the time did not show any retained foreign object. He also had an ultrasound with the same result. The area is quite tender and has hypertrophic granulation tissue present. He says that it has never drained purulent material. ABI in clinic today was 1.28. He has been applying Medihoney and says that he has an Transport planner that he occasionally wears. He does not spend time on his feet at work. No obvious signs of infection on exam. Patient History Information obtained from Patient. Allergies nickel (Reaction: rash) Family History Unknown History. Social History Former smoker - vaping - ended on 12/09/2009, Marital Status - Married, Alcohol Use - Never, Drug Use - No History, Caffeine Use - Daily - Tea. Medical History Respiratory Patient has history of Asthma - Childhood asthma Musculoskeletal Patient has history of Osteoarthritis - Left Ankle Review of Systems (ROS) Constitutional Symptoms (General Health) Denies complaints or symptoms of Fatigue, Fever, Chills, Marked Weight Change. Garrett Herrera, Garrett Herrera ([id number]) [id number] Page 5 of 8 Eyes Denies complaints or symptoms of Dry Eyes, Vision Changes, Glasses / Contacts. Ear/Nose/Mouth/Throat Denies complaints or symptoms of Chronic sinus problems or rhinitis. Cardiovascular Denies complaints or symptoms of Chest pain. Gastrointestinal Denies complaints or symptoms of Frequent diarrhea, Nausea, Vomiting. Endocrine Denies complaints or symptoms of Heat/cold  intolerance. Genitourinary Denies complaints or symptoms of Frequent urination. Integumentary (Skin) Complains or has symptoms of Wounds - Recurrent L heel. Neurologic Denies complaints or symptoms of Numbness/parasthesias. Psychiatric Denies complaints or symptoms of Claustrophobia. Objective Constitutional No acute distress. Vitals Time Taken: 9:01 AM, Height: 67 in, Source: Stated, Weight: 302 lbs, Source: Stated, BMI: 47.3, Temperature: 99.2 F, Pulse: 85 bpm, Respiratory Rate: 16 breaths/min, Blood Pressure: 130/84 mmHg. Respiratory Normal work of breathing on room air. General Notes: 12/10/2022: On the posterolateral aspect of the patient'Herrera left heel, there is a small circular wound with hypertrophic granulation tissue. It is quite tender. There is no fluctuance surrounding the wound. No erythema, induration, or drainage. There is no depth to the wound. Integumentary (Hair, Skin) Wound #1 status is Open. Original cause of wound was Trauma. The date acquired was: 10/10/2019. The wound is located on the Left,Lateral Foot. The wound measures 0.4cm length x 0.4cm width x 0.1cm depth; 0.126cm^2 area and 0.013cm^3 volume. There is Fat Layer (Subcutaneous Tissue) exposed. There is no tunneling or undermining noted. There is a small amount of serosanguineous drainage noted. The wound margin is flat and intact. There is large (67-100%) red, hyper - granulation within  the wound bed. There is no necrotic tissue within the wound bed. The periwound skin appearance had no abnormalities noted for texture. The periwound skin appearance had no abnormalities noted for moisture. The periwound skin appearance had no abnormalities noted for color. Periwound temperature was noted as No Abnormality. The periwound has tenderness on palpation. Assessment Active Problems ICD-10 Non-pressure chronic ulcer of left heel and midfoot with fat layer exposed Morbid (severe) obesity due to excess  calories Plan Follow-up Appointments: Return Appointment in 1 week. - Dr. Lady Gary RM 2 Wed 5/8 @ 0915 am Anesthetic: Wound #1 Left,Lateral Foot: (In clinic) Topical Lidocaine 4% applied to wound bed Bathing/ Shower/ Hygiene: May shower and wash wound with soap and water. Radiology ordered were: Computed T omography (CT) Scan , left Lower extremity with contrast - nonhealing ulcer left lateral calcaneus evaluate for foreign body WOUND #1: - Foot Wound Laterality: Left, Lateral Prim Dressing: Hydrofera Blue Ready Transfer Foam, 2.5x2.5 (in/in) (DME) (Dispense As Written) 1 x Per Day/30 Days ary Discharge Instructions: Apply directly to wound bed as directed Secondary Dressing: Woven Gauze Sponge, Non-Sterile 4x4 in (DME) (Generic) 1 x Per Day/30 Days Discharge Instructions: Apply over primary dressing as directed. Secured With: [employer], 4.5x3.1 (in/yd) (DME) (Generic) 1 x Per Day/30 Days Discharge Instructions: Secure with Kerlix as directed. Secured With: Transpore Surgical T ape, 2x10 (in/yd) 1 x Per Day/30 Days Discharge Instructions: Secure dressing with tape as directed. Garrett Herrera, Garrett Herrera ([id number]) [id number] Page 6 of 8 12/10/2022: This is a 32 year old otherwise healthy young man who has a chronic wound on his heel. He says that it will open and reclose spontaneously. On the posterolateral aspect of the patient'Herrera left heel, there is a small circular wound with hypertrophic granulation tissue. It is quite tender. There is no fluctuance surrounding the wound. No erythema, induration, or drainage. There is no depth to the wound. I am highly suspicious for a retained foreign body that happens to not be radiopaque, such as glass or plastic. I am going to order a CT scan of the foot to evaluate for this. I had planned to chemically cauterize the hypertrophic granulation tissue with silver nitrate, but the patient states he has an allergy to  silver. We will use Hydrofera Blue classic as a contact layer to try and suppress the growth of the hypertrophic granulation tissue and pad the foot to avoid further trauma infection to the wound site. I will have him follow-up in 2 Herrera' time. Electronic Signature(Herrera) Signed: 12/10/2022 12:16:21 PM By: Duanne Guess MD FACS Signed: 12/10/2022 4:44:16 PM By: Garrett Deed RN, BSN Previous Signature: 12/10/2022 11:01:09 AM Version By: Duanne Guess MD FACS Previous Signature: 12/10/2022 9:58:57 AM Version By: Duanne Guess MD FACS Entered By: Garrett Herrera on 12/10/2022 11:49:16 -------------------------------------------------------------------------------- HxROS Details Patient Name: Date of Service: Garrett Herrera, Garrett Herrera 12/10/2022 9:00 A M Medical Record Number: [id number] Patient Account Number: [id number] Date of Birth/Sex: Treating RN: Jan 25, 1991 (31 y.o. Dianna Limbo Primary Care Provider: Milus Height Other Clinician: Referring Provider: Treating Provider/Extender: Verneda Skill in Treatment: 0 Information Obtained From Patient Constitutional Symptoms (General Health) Complaints and Symptoms: Negative for: Fatigue; Fever; Chills; Marked Weight Change Eyes Complaints and Symptoms: Negative for: Dry Eyes; Vision Changes; Glasses / Contacts Ear/Nose/Mouth/Throat Complaints and Symptoms: Negative for: Chronic sinus problems or rhinitis Cardiovascular Complaints and Symptoms: Negative for: Chest pain Gastrointestinal Complaints and Symptoms: Negative for: Frequent diarrhea; Nausea; Vomiting Endocrine Complaints and Symptoms: Negative for: Heat/cold intolerance Genitourinary Complaints  and Symptoms: Negative for: Frequent urination Integumentary (Skin) Complaints and Symptoms: Positive for: Wounds - Recurrent L heel Neurologic Complaints and Symptoms: Garrett Herrera, Garrett Herrera ([id number]) [id number] Page 7 of  8 Negative for: Numbness/parasthesias Psychiatric Complaints and Symptoms: Negative for: Claustrophobia Hematologic/Lymphatic Respiratory Medical History: Positive for: Asthma - Childhood asthma Immunological Musculoskeletal Medical History: Positive for: Osteoarthritis - Left Ankle Oncologic Immunizations Pneumococcal Vaccine: Received Pneumococcal Vaccination: No Tetanus Vaccine: Last tetanus shot: 12/10/2019 Implantable Devices None Family and Social History Unknown History: Yes; Former smoker - vaping - ended on 12/09/2009; Marital Status - Married; Alcohol Use: Never; Drug Use: No History; Caffeine Use: Daily - T Financial Concerns: No; Food, Clothing or Shelter Needs: No; Support System Lacking: No; Transportation Concerns: No ea; Electronic Signature(Herrera) Signed: 12/10/2022 11:01:09 AM By: Duanne Guess MD FACS Signed: 12/10/2022 12:25:06 PM By: Karie Schwalbe RN Entered By: Karie Schwalbe on 12/10/2022 09:11:27 -------------------------------------------------------------------------------- SuperBill Details Patient Name: Date of Service: Garrett Herrera, Garrett Herrera 12/10/2022 Medical Record Number: [id number] Patient Account Number: [id number] Date of Birth/Sex: Treating RN: 04/04/1991 (31 y.o. Garrett Herrera Primary Care Provider: Milus Height Other Clinician: Referring Provider: Treating Provider/Extender: Verneda Skill in Treatment: 0 Diagnosis Coding ICD-10 Codes Code Description [phone] Non-pressure chronic ulcer of left heel and midfoot with fat layer exposed E66.01 Morbid (severe) obesity due to excess calories Facility Procedures : CPT4 Code: 06301601 Description: WOUND CARE VISIT-LEV 3 NEW PT Modifier: 25 Quantity: 1 : CPT4 Code: 09323557 Description: 32202 - DEBRIDE W/O ANES NON SELECT Modifier: Quantity: 1 Physician Procedures : CPT4 Code Description Modifier [id number] - WC PHYS LEVEL 4 - NEW PT ICD-10 Diagnosis  Description Garrett Herrera, Garrett Herrera ([id number]) [id number] L97.422 Non-pressure chronic ulcer of left heel and midfoot with fat layer exposed  E66.01 Morbid (severe) obesity due to excess calories Quantity: 1 27.pdf Page 8 of 8 Electronic Signature(Herrera) Signed: 12/10/2022 4:44:16 PM By: Garrett Deed RN, BSN Signed: 12/11/2022 7:39:09 AM By: Duanne Guess MD FACS Previous Signature: 12/10/2022 10:00:28 AM Version By: Duanne Guess MD FACS Entered By: Garrett Herrera on 12/10/2022 16:42:18

## 2022-12-15 DIAGNOSIS — L97422 Non-pressure chronic ulcer of left heel and midfoot with fat layer exposed: Secondary | ICD-10-CM | POA: Diagnosis not present

## 2022-12-17 ENCOUNTER — Encounter (HOSPITAL_BASED_OUTPATIENT_CLINIC_OR_DEPARTMENT_OTHER): Payer: BC Managed Care – PPO | Admitting: General Surgery

## 2022-12-17 DIAGNOSIS — L97422 Non-pressure chronic ulcer of left heel and midfoot with fat layer exposed: Secondary | ICD-10-CM | POA: Diagnosis not present

## 2022-12-17 DIAGNOSIS — Z6841 Body Mass Index (BMI) 40.0 and over, adult: Secondary | ICD-10-CM | POA: Diagnosis not present

## 2022-12-17 NOTE — Progress Notes (Signed)
THOAMS, KERIN ([id number]) [id number] Page 1 of 1 Visit Report for 12/17/2022 SuperBill Details Patient Name: Date of Service: Garrett Herrera, Garrett Herrera 12/17/2022 Medical Record Number: [id number] Patient Account Number: [id number] Date of Birth/Sex: Treating RN: 1990-11-10 (32 y.o. M) Primary Care Provider: Milus Height Other Clinician: Referring Provider: Treating Provider/Extender: Verneda Skill in Treatment: 1 Diagnosis Coding ICD-10 Codes Code Description [phone] Non-pressure chronic ulcer of left heel and midfoot with fat layer exposed E66.01 Morbid (severe) obesity due to excess calories Facility Procedures CPT4 Code Description Modifier Quantity [id number] - WOUND CARE VISIT-LEV 2 EST PT 1 Electronic Signature(s) Signed: 12/17/2022 10:37:02 AM By: Duanne Guess MD FACS Signed: 12/17/2022 4:18:40 PM By: Thayer Dallas Entered By: Thayer Dallas on 12/17/2022 10:11:00

## 2022-12-17 NOTE — Progress Notes (Signed)
Garrett Herrera ([id number]) [id number] Page 1 of 5 Visit Report for 12/17/2022 Arrival Information Details Patient Name: Date of Service: Garrett Herrera, Garrett Herrera 12/17/2022 9:45 A M Medical Record Number: [id number] Patient Account Number: [id number] Date of Birth/Sex: Treating RN: 06-20-1991 (32 y.o. M) Primary Care Tymika Grilli: Garrett Herrera Other Clinician: Referring Garrett Herrera: Treating Garrett Herrera/Extender: Garrett Herrera in Treatment: 1 Visit Information History Since Last Visit Added or deleted any medications: No Patient Arrived: Ambulatory Any new allergies or adverse reactions: No Arrival Time: 09:36 Had a fall or experienced change in No Accompanied By: self activities of daily living that may affect Transfer Assistance: None risk of falls: Patient Identification Verified: Yes Signs or symptoms of abuse/neglect since last visito No Secondary Verification Process Completed: Yes Hospitalized since last visit: No Patient Requires Transmission-Based Precautions: No Implantable device outside of the clinic excluding No Patient Has Alerts: No cellular tissue based products placed in the center since last visit: Has Dressing in Place as Prescribed: Yes Has Compression in Place as Prescribed: Yes Pain Present Now: No Electronic Signature(s) Signed: 12/17/2022 4:18:40 PM By: Garrett Herrera Entered By: Garrett Herrera on 12/17/2022 09:36:55 -------------------------------------------------------------------------------- Clinic Level of Care Assessment Details Patient Name: Date of Service: Garrett Herrera, Garrett Herrera 12/17/2022 9:45 A M Medical Record Number: [id number] Patient Account Number: [id number] Date of Birth/Sex: Treating RN: 1991-08-04 (32 y.o. M) Primary Care Garrett Herrera: Garrett Herrera Other Clinician: Referring Garrett Herrera: Treating Garrett Herrera: Garrett Herrera in Treatment: 1 Clinic Level of Care Assessment  Items TOOL 4 Quantity Score X- 1 0 Use when only an EandM is performed on FOLLOW-UP visit ASSESSMENTS - Nursing Assessment / Reassessment X- 1 10 Reassessment of Co-morbidities (includes updates in patient status) X- 1 5 Reassessment of Adherence to Treatment Plan ASSESSMENTS - Wound and Skin A ssessment / Reassessment X - Simple Wound Assessment / Reassessment - one wound 1 5 []  - 0 Complex Wound Assessment / Reassessment - multiple wounds []  - 0 Dermatologic / Skin Assessment (not related to wound area) ASSESSMENTS - Focused Assessment []  - 0 Circumferential Edema Measurements - multi extremities []  - 0 Nutritional Assessment / Counseling / Intervention []  - 0 Lower Extremity Assessment (monofilament, tuning fork, pulses) []  - 0 Peripheral Arterial Disease Assessment (using hand held doppler) ASSESSMENTS - Ostomy and/or Continence Assessment and Care []  - 0 Incontinence Assessment and Management []  - 0 Ostomy Care Assessment and Management (repouching, etc.) PROCESS - Coordination of Care Garrett Herrera, Garrett Herrera ([id number]) [id number] Page 2 of 5 X- 1 15 Simple Patient / Family Education for ongoing care []  - 0 Complex (extensive) Patient / Family Education for ongoing care []  - 0 Staff obtains Chiropractor, Records, T Results / Process Orders est []  - 0 Staff telephones HHA, Nursing Homes / Clarify orders / etc []  - 0 Routine Transfer to another Facility (non-emergent condition) []  - 0 Routine Hospital Admission (non-emergent condition) []  - 0 New Admissions / Manufacturing engineer / Ordering NPWT Apligraf, etc. , []  - 0 Emergency Hospital Admission (emergent condition) X- 1 10 Simple Discharge Coordination []  - 0 Complex (extensive) Discharge Coordination PROCESS - Special Needs []  - 0 Pediatric / Minor Patient Management []  - 0 Isolation Patient Management []  - 0 Hearing / Language / Visual special needs []  - 0 Assessment of  Community assistance (transportation, D/C planning, etc.) []  - 0 Additional assistance / Altered mentation []  - 0 Support Surface(s) Assessment (bed, cushion, seat, etc.) INTERVENTIONS - Wound Cleansing / Measurement X - Simple Wound Cleansing -  one wound 1 5 []  - 0 Complex Wound Cleansing - multiple wounds []  - 0 Wound Imaging (photographs - any number of wounds) []  - 0 Wound Tracing (instead of photographs) []  - 0 Simple Wound Measurement - one wound []  - 0 Complex Wound Measurement - multiple wounds INTERVENTIONS - Wound Dressings X - Small Wound Dressing one or multiple wounds 1 10 []  - 0 Medium Wound Dressing one or multiple wounds []  - 0 Large Wound Dressing one or multiple wounds []  - 0 Application of Medications - topical []  - 0 Application of Medications - injection INTERVENTIONS - Miscellaneous []  - 0 External ear exam []  - 0 Specimen Collection (cultures, biopsies, blood, body fluids, etc.) []  - 0 Specimen(s) / Culture(s) sent or taken to Lab for analysis []  - 0 Patient Transfer (multiple staff / Nurse, adult / Similar devices) []  - 0 Simple Staple / Suture removal (25 or less) []  - 0 Complex Staple / Suture removal (26 or more) []  - 0 Hypo / Hyperglycemic Management (close monitor of Blood Glucose) []  - 0 Ankle / Brachial Index (ABI) - do not check if billed separately []  - 0 Vital Signs Has the patient been seen at the hospital within the last three years: Yes Total Score: 60 Level Of Care: New/Established - Level 2 Electronic Signature(s) Signed: 12/17/2022 4:18:40 PM By: Garrett Herrera, Garrett Herrera ([id number]) [id number] Page 3 of 5 Entered By: Garrett Herrera on 12/17/2022 10:10:12 -------------------------------------------------------------------------------- Encounter Discharge Information Details Patient Name: Date of Service: Garrett Herrera, Garrett Herrera 12/17/2022 9:45 A M Medical Record Number: [id number] Patient  Account Number: [id number] Date of Birth/Sex: Treating RN: 04/21/1991 (32 y.o. M) Primary Care Garrett Herrera: Garrett Herrera Other Clinician: Thayer Herrera Referring Garrett Herrera: Treating Garrett Herrera/Extender: Garrett Herrera in Treatment: 1 Encounter Discharge Information Items Discharge Condition: Stable Ambulatory Status: Ambulatory Discharge Destination: Home Transportation: Private Auto Accompanied By: self Schedule Follow-up Appointment: Yes Clinical Summary of Care: Electronic Signature(s) Signed: 12/17/2022 4:18:40 PM By: Garrett Herrera Entered By: Garrett Herrera on 12/17/2022 10:10:50 -------------------------------------------------------------------------------- Patient/Caregiver Education Details Patient Name: Date of Service: Garrett Herrera 5/8/2024andnbsp9:45 A M Medical Record Number: [id number] Patient Account Number: [id number] Date of Birth/Gender: Treating RN: 06/09/1991 (31 y.o. M) Primary Care Physician: Garrett Herrera Other Clinician: Thayer Herrera Referring Physician: Treating Physician/Extender: Garrett Herrera in Treatment: 1 Education Assessment Education Provided To: Patient Education Topics Provided Electronic Signature(s) Signed: 12/17/2022 4:18:40 PM By: Garrett Herrera Entered By: Garrett Herrera on 12/17/2022 10:10:35 -------------------------------------------------------------------------------- Wound Assessment Details Patient Name: Date of Service: Garrett Herrera, Garrett Herrera 12/17/2022 9:45 A M Medical Record Number: [id number] Patient Account Number: [id number] Date of Birth/Sex: Treating RN: 08-May-1991 (32 y.o. M) Primary Care Lucia Harm: Garrett Herrera Other Clinician: Referring Danielle Lento: Treating Para Cossey/Extender: Stevan Born Weeks in Treatment: 1 Wound Status Wound Number: 1 Primary Etiology: Trauma, Other Wound Location: Left, Lateral Foot Wound Status: Open Wounding Event:  Trauma Date Acquired: 10/10/2019 Weeks Of Treatment: 1 Clustered Wound: No Wound Measurements Bomar, Maclin ([id number]) Length: (cm) 0.4 Width: (cm) 0.4 Depth: (cm) 0.1 Area: (cm) 0.126 Volume: (cm) 0.013 [id number] Page 4 of 5 % Reduction in Area: 0% % Reduction in Volume: 0% Wound Description Classification: Full Thickness Without Exposed Suppor Exudate Amount: Small Exudate Type: Serosanguineous Exudate Color: red, brown t Structures Periwound Skin Texture Texture Color No Abnormalities Noted: No No Abnormalities Noted: No Moisture No Abnormalities Noted: No Treatment Notes Wound #1 (Foot) Wound Laterality: Left, Lateral Cleanser Peri-Wound Care Topical Primary Dressing  Hydrofera Blue Ready Transfer Foam, 2.5x2.5 (in/in) Discharge Instruction: Apply directly to wound bed as directed Secondary Dressing Woven Gauze Sponge, Non-Sterile 4x4 in Discharge Instruction: Apply over primary dressing as directed. Secured With [employer], 4.5x3.1 (in/yd) Discharge Instruction: Secure with Kerlix as directed. Transpore Surgical Tape, 2x10 (in/yd) Discharge Instruction: Secure dressing with tape as directed. Compression Wrap Compression Stockings Add-Ons Electronic Signature(s) Signed: 12/17/2022 4:18:40 PM By: Garrett Herrera Entered By: Garrett Herrera on 12/17/2022 09:37:14 -------------------------------------------------------------------------------- Vitals Details Patient Name: Date of Service: Garrett Herrera, Garrett Herrera 12/17/2022 9:45 A M Medical Record Number: [id number] Patient Account Number: [id number] Date of Birth/Sex: Treating RN: [birth year] (32 y.o. M) Primary Care Kito Cuffe: Garrett Herrera Other Clinician: Referring Jeramey Lanuza: Treating Mallery Harshman/Extender: Garrett Herrera in Treatment: 1 Vital Signs Time Taken: 09:36 Reference Range: 80 - 120 mg / dl Herrera (in): 67 Weight (lbs): 302 Body Mass Index  (BMI): 47.3 Electronic Signature(s) Signed: 12/17/2022 4:18:40 PM By: Garrett Herrera, Garrett Herrera ([id number]) [id number] Page 5 of 5 Entered By: Garrett Herrera on 12/17/2022 09:37:04

## 2022-12-31 ENCOUNTER — Encounter (HOSPITAL_BASED_OUTPATIENT_CLINIC_OR_DEPARTMENT_OTHER): Payer: BC Managed Care – PPO | Admitting: General Surgery

## 2022-12-31 ENCOUNTER — Encounter (HOSPITAL_COMMUNITY): Payer: Self-pay

## 2022-12-31 ENCOUNTER — Ambulatory Visit (HOSPITAL_COMMUNITY): Payer: BC Managed Care – PPO

## 2022-12-31 DIAGNOSIS — J45909 Unspecified asthma, uncomplicated: Secondary | ICD-10-CM | POA: Diagnosis not present

## 2022-12-31 DIAGNOSIS — L97422 Non-pressure chronic ulcer of left heel and midfoot with fat layer exposed: Secondary | ICD-10-CM | POA: Diagnosis not present

## 2022-12-31 DIAGNOSIS — M1991 Primary osteoarthritis, unspecified site: Secondary | ICD-10-CM | POA: Diagnosis not present

## 2022-12-31 DIAGNOSIS — Z6841 Body Mass Index (BMI) 40.0 and over, adult: Secondary | ICD-10-CM | POA: Diagnosis not present

## 2023-01-14 ENCOUNTER — Ambulatory Visit (HOSPITAL_COMMUNITY)
Admission: RE | Admit: 2023-01-14 | Discharge: 2023-01-14 | Disposition: A | Discharge status: Home / Self Care | Payer: BC Managed Care – PPO | Source: Ambulatory Visit | Attending: General Surgery | Admitting: General Surgery

## 2023-01-14 DIAGNOSIS — R600 Localized edema: Secondary | ICD-10-CM | POA: Diagnosis not present

## 2023-01-14 DIAGNOSIS — L97422 Non-pressure chronic ulcer of left heel and midfoot with fat layer exposed: Secondary | ICD-10-CM

## 2023-01-14 MED ORDER — IOHEXOL 300 MG/ML  SOLN
100.0000 mL | Freq: Once | INTRAMUSCULAR | Status: AC | PRN
  Administered 2023-01-14: 100 mL via INTRAVENOUS

## 2023-01-27 ENCOUNTER — Encounter (HOSPITAL_BASED_OUTPATIENT_CLINIC_OR_DEPARTMENT_OTHER): Payer: Self-pay | Admitting: General Surgery

## 2023-06-24 DIAGNOSIS — M546 Pain in thoracic spine: Secondary | ICD-10-CM | POA: Diagnosis not present

## 2023-06-24 DIAGNOSIS — M50322 Other cervical disc degeneration at C5-C6 level: Secondary | ICD-10-CM | POA: Diagnosis not present

## 2023-06-24 DIAGNOSIS — M25572 Pain in left ankle and joints of left foot: Secondary | ICD-10-CM | POA: Diagnosis not present

## 2023-07-01 DIAGNOSIS — M9904 Segmental and somatic dysfunction of sacral region: Secondary | ICD-10-CM | POA: Diagnosis not present

## 2023-07-01 DIAGNOSIS — M9901 Segmental and somatic dysfunction of cervical region: Secondary | ICD-10-CM | POA: Diagnosis not present

## 2023-07-01 DIAGNOSIS — M546 Pain in thoracic spine: Secondary | ICD-10-CM | POA: Diagnosis not present

## 2023-07-01 DIAGNOSIS — M9903 Segmental and somatic dysfunction of lumbar region: Secondary | ICD-10-CM | POA: Diagnosis not present
